# Patient Record
Sex: Female | Born: 1995 | Race: White | Hispanic: No | Marital: Single | Smoking: Never smoker
Health system: Southern US, Community
[De-identification: ages and names within clinical notes are randomized; demographics above are authoritative.]

## PROBLEM LIST (undated history)

## (undated) DIAGNOSIS — T148XXA Other injury of unspecified body region, initial encounter: Secondary | ICD-10-CM

## (undated) HISTORY — PX: KNEE ARTHROSCOPY W/ ACL RECONSTRUCTION: SHX1858

---
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
## 2017-02-27 ENCOUNTER — Encounter (INDEPENDENT_AMBULATORY_CARE_PROVIDER_SITE_OTHER): Payer: Self-pay | Admitting: INTERNAL MEDICINE

## 2017-02-27 NOTE — Progress Notes (Signed)
I spoke to Difficult RunSerafina while she is away at college.     Dx -- Strep pharyngitis again. Her student health service prescribed her PVK, which is fine.   She asked about tonsil removal. I recommended not doing that at this time. Let's see if her health improves as she finishes her freshman year of college.   If she continues to have multiple episodes of pharyngitis requiring antibiotics, then I will consider ENT referral for discussion of this

## 2017-04-16 ENCOUNTER — Ambulatory Visit (INDEPENDENT_AMBULATORY_CARE_PROVIDER_SITE_OTHER): Payer: Self-pay | Admitting: INTERNAL MEDICINE

## 2017-04-16 MED ORDER — VENTOLIN HFA 90 MCG/ACTUATION AEROSOL INHALER
INHALATION_SPRAY | RESPIRATORY_TRACT | 5 refills | Status: DC
Start: 2017-04-16 — End: 2021-07-09

## 2017-04-16 NOTE — Telephone Encounter (Signed)
Kimberly Cooke came by to see me at my office today     CC: chronic cough and a stuffy nose "for weeks"     She has not been taking any medication   Just finished finals and came home from college for the summer   No fevers   Otherwise feels fine     PE:   Ears -- clear on both sides   Nose -- thin DC on both sides: clear, no thick at all   Throat -- post-nasal drip; no tonsilitis    Lungs -- wheezing on inhalation in BL upper lobes (prominent); less prominent in lower lobes     Dx:   Seasonal allergic rhinitis --> Rx Allegra 180mg  daily for one week. If not sufficient, then I will add daily Flonase on top of this     Reactive airways -- ? Cough-variant asthma vs. Allergic airways --> albuterol inhaler 1 puff TID for the coming week, to see if it helps If it does, then continue the whole allergy season.     Kimberly BrunetteFina will contact me in a week and let me know how she is doing

## 2017-06-27 ENCOUNTER — Other Ambulatory Visit
Admission: RE | Admit: 2017-06-27 | Discharge: 2017-06-27 | Disposition: A | Discharge status: Home / Self Care | Payer: No Typology Code available for payment source | Source: Ambulatory Visit | Attending: Gynecology | Admitting: Gynecology

## 2017-06-30 LAB — VH STD AMPLIFIED DNA PROBE
Chlamydia trachomatis: NEGATIVE
Neisseria gonorrhoeae: NEGATIVE

## 2017-09-16 ENCOUNTER — Ambulatory Visit (INDEPENDENT_AMBULATORY_CARE_PROVIDER_SITE_OTHER): Payer: Self-pay | Admitting: INTERNAL MEDICINE

## 2017-12-15 ENCOUNTER — Encounter (INDEPENDENT_AMBULATORY_CARE_PROVIDER_SITE_OTHER): Payer: Self-pay | Admitting: INTERNAL MEDICINE

## 2017-12-15 NOTE — Progress Notes (Signed)
Encounter opened to scan records

## 2018-07-06 ENCOUNTER — Encounter (INDEPENDENT_AMBULATORY_CARE_PROVIDER_SITE_OTHER): Payer: Self-pay | Admitting: INTERNAL MEDICINE

## 2018-07-06 NOTE — Progress Notes (Signed)
Encounter opened to scan records

## 2018-07-10 ENCOUNTER — Encounter (INDEPENDENT_AMBULATORY_CARE_PROVIDER_SITE_OTHER): Payer: Self-pay | Admitting: INTERNAL MEDICINE

## 2018-07-10 ENCOUNTER — Other Ambulatory Visit (INDEPENDENT_AMBULATORY_CARE_PROVIDER_SITE_OTHER): Payer: Self-pay | Admitting: INTERNAL MEDICINE

## 2018-07-10 MED ORDER — ACYCLOVIR 800 MG TABLET
ORAL_TABLET | ORAL | 0 refills | Status: DC
Start: 2018-07-10 — End: 2021-07-09

## 2018-07-10 MED ORDER — TRIAMCINOLONE ACETONIDE 0.5 % TOPICAL CREAM
TOPICAL_CREAM | CUTANEOUS | 2 refills | Status: DC
Start: 2018-07-10 — End: 2021-07-09

## 2018-07-10 NOTE — Progress Notes (Signed)
Kimberly Cooke came to see me today for an acute onset of a "tingling, itching, burning" wash on her right arm.     Started in the past 24 hours.   Feels like a sunburn. Also itches.     It has a raised characteristic to it like poison ivy.     Exam:   Rash on the right arm; right forearm and on the anterior right shoulder: looks worrisome for shingles   No weeping   No excoriation     Dx --   Shingles vs. Atypical poison ivy. The long streak down the arm makes shingles so much more likely.     Rx:   Acyclovir 800mg  QID for one week   Topical triamcinolone TID to skin for itching/burning     Keep arm covered   Call me next week to let me know how it is progressing

## 2018-07-21 ENCOUNTER — Other Ambulatory Visit
Admission: RE | Admit: 2018-07-21 | Discharge: 2018-07-21 | Disposition: A | Discharge status: Home / Self Care | Payer: No Typology Code available for payment source | Source: Ambulatory Visit | Attending: Gynecology | Admitting: Gynecology

## 2018-07-22 LAB — VH STD AMPLIFIED DNA PROBE
Chlamydia trachomatis: NEGATIVE
Neisseria gonorrhoeae: NEGATIVE

## 2018-08-11 ENCOUNTER — Ambulatory Visit (INDEPENDENT_AMBULATORY_CARE_PROVIDER_SITE_OTHER): Payer: BC Managed Care – PPO

## 2018-08-11 ENCOUNTER — Encounter (INDEPENDENT_AMBULATORY_CARE_PROVIDER_SITE_OTHER): Payer: Self-pay

## 2018-08-11 DIAGNOSIS — Z23 Encounter for immunization: Secondary | ICD-10-CM

## 2018-08-11 DIAGNOSIS — A35 Other tetanus: Secondary | ICD-10-CM

## 2018-09-16 ENCOUNTER — Ambulatory Visit (INDEPENDENT_AMBULATORY_CARE_PROVIDER_SITE_OTHER): Payer: Self-pay | Admitting: INTERNAL MEDICINE

## 2018-09-16 MED ORDER — AMOXICILLIN 500 MG-POTASSIUM CLAVULANATE 125 MG TABLET
ORAL_TABLET | ORAL | 0 refills | Status: DC
Start: 2018-09-16 — End: 2021-07-09

## 2018-09-16 NOTE — Telephone Encounter (Signed)
Seen today by me:     Hx -- collided with another player in a [employer] soccer game   Dx - laceration on her tongue: 15mm long and about the half the depth of her tongue     Rx antibiotics to make sure it does not get infected   If if continues to bleed, then it may need to be sewn together     Augmentin 500 mg BID x 7 days --> [facility]   Call me later if her tongue starts to bleed and won't stop

## 2018-11-20 ENCOUNTER — Encounter (INDEPENDENT_AMBULATORY_CARE_PROVIDER_SITE_OTHER): Payer: Self-pay | Admitting: INTERNAL MEDICINE

## 2018-11-20 ENCOUNTER — Ambulatory Visit (INDEPENDENT_AMBULATORY_CARE_PROVIDER_SITE_OTHER): Payer: BC Managed Care – PPO | Admitting: INTERNAL MEDICINE

## 2018-11-20 VITALS — BP 118/60 | HR 52 | Temp 97.6°F | Ht 68.0 in | Wt 137.0 lb

## 2018-11-20 DIAGNOSIS — Z0000 Encounter for general adult medical examination without abnormal findings: Secondary | ICD-10-CM

## 2018-11-20 DIAGNOSIS — Z6820 Body mass index (BMI) 20.0-20.9, adult: Secondary | ICD-10-CM

## 2018-11-20 MED ORDER — IBUPROFEN 600 MG TABLET
600.00 mg | ORAL_TABLET | Freq: Every day | ORAL | 0 refills | Status: DC | PRN
Start: 2018-11-20 — End: 2021-07-09

## 2018-11-20 NOTE — Nursing Note (Signed)
BP 118/60   Pulse 52   Temp 36.4 C (97.6 F)   Ht 1.727 m (5\' 8" )   Wt 62.1 kg (137 lb)   SpO2 98%   BMI 20.83 kg/m   Facility age limit for growth percentiles is 20 years.  April Curry, [place]  11/20/2018, 13:23

## 2018-11-20 NOTE — Progress Notes (Signed)
[facility]   [address]   [place], [place] [address]  [phone]  [phone]    Wellness Visit     SUBJECTIVE   Kimberly Cooke is a 22 y.o. female here to check-in on primary care issues. It has been several years since she had a real check-in with a PCP.     She did injure her in a tongue in a soccer game --> she stopped by the office and I looked and her tongue and sent her to a dentist.     Since our last visit together, has experienced the following healthcare interactions:   ER -- no visits in 2019   Urgent care visits -- no visits in 2019   Accidents with a car -- none in the past year   Injuries at home -- tongue injury this fall   Medications from other providers --   Surgeries or other procedures -- several stitches put in her tongue by the dentist     Vision -- went to the eye doctor summer 2019: contact/glasses for driving --> no serious eye disease    Hearing -- no new damage to he hearing     Sources of pain which are new -- her knees do hurt most days     Sleep quality -- no issues     Tobacco products -- no vaping     Alcohol consumption -- typically once per week     Contact with other providers -- saw the GYN provider in 2019     Exercise inventory -- she is extremely physically active     Medications reviewed by me personally at this time:   3 ibuprofen ibuprofen daily PRN for knee pain --> takes after working out   Oral birth control pills   No energy drinks   No herbal products     Changes in family history since we last met --   Mother -- alive and well   Father -- died of pancreatic cancer in his 23's     Changes in social history since we last met --   Going to [employer]       Medications reviewed    Current Outpatient Medications:   .  acyclovir (ZOVIRAX) 800 mg Oral Tablet, 1 4 times per day for SHINGLES, Disp: 30 Tab, Rfl: 0  .  amoxicillin-pot clavulanate (AUGMENTIN) 500-125 mg Oral Tablet, 1 twice per day with food, Disp: 14 Tab, Rfl: 0  .   triamcinolone acetonide (ARISTOCORT) 0.5 % Cream, Use a thin layer to affected skin 3 times per day, Disp: 1 Tube, Rfl: 2  .  VENTOLIN HFA 90 mcg/actuation Inhalation HFA Aerosol Inhaler, 1 puff 3 times per day for coughing, Disp: 1 Inhaler, Rfl: 5      Review of Systems -     Constitutional: weight loss intentionally in the past year   Eyes: no new blurry or double vision   ENT: no acute hearing loss   Cardiovascular: no new chest pain   Respiratory: no new shortness of breath  GI: no new vomiting or diarrhea   GU: no new dysuria or hematuria   Musculoskeletal: chronic aches and pains   Neurological: no new seizure or headaches   Emotional/Pscychiatric: no depression or anxiety that is new   Skin: no suspicious moles which are new; no acute rashes   Endocrine: no new diabetes   Hematologic/Lymphatic: no DVT or bleeding issues      Social History  Tobacco Use   Smoking Status Never Smoker   Smokeless Tobacco Never Used       OBJECTIVE:  There were no vitals taken for this visit.        Counseling done about the following face-to-face:     Labs reviewed by me   Drug-drug interactions considered and reviewed by me     Physical Exam     General -- alert and oriented   Repeat BP taken by me in the left arm -- 95/60     HEENT -- PERRL, EOMI   Heart -- RRR and rhythm   Neck -- Thyroid is normal sized and not tender   Lungs -- clear and no wheezing; no stridor and no pleuritic pain   Abdomen -- soft and tender; nor guarding and   Neuro -- normal gait and tone; strength is 5/5  Skin -- no rashes noted   Psych -- pleasant and smiling   Vascular -- no edema     Current weight 137-lbs   A year ago -- 150-lbs     We talked about her weight change in the past year: her diet is very clean; she is working out more and sounds super=-fit. No eating disorder concerns on my part       Medications -- reviewed by me; drug-drug interactions considered by me    Counseling provided directly face-to-face about: getting a flu shot today          ASSESSMENT AND PLAN:  Multiple previous knee surgeries -- Kimberly Cooke has had both of her knee operated on in the past. I see no reason to go back to Orthopedics at this time.   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   Her use of ibuprofen is not that often, so I can live with it.  I see no reason to go back to Orthopedics at this time      Screening for Diabetes -- no reason to check until after 20 years old     High cholesterol screening -- no reason to screen until after 20 years old     Vision -- has eye glasses and does well     Hearing issues -- no limitations     Exercise --  She is very fit     Need for HIV or hepatitis C screening -- no new risk factors; no reason for screening     Tobacco abuse screening -- no tobacco products     Alcohol and drug abuse screening -- weekly alcohol use: denies drinking     Depression/anxiety screening -- she denies any issues     Screening for colorectal cancer -- she can wait until she is 22 years old     Screening for breast cancer -- no elevated family history at all.     GYN -- she sees Dr. Nicole Kindred     Immunizations -- I recommend a yearly flu shot: she will get together        DISPOSITION:   Follow up in a year sooner PRN   Flu shot given today     Keep up the great work

## 2018-12-25 ENCOUNTER — Telehealth (INDEPENDENT_AMBULATORY_CARE_PROVIDER_SITE_OTHER): Payer: Self-pay | Admitting: INTERNAL MEDICINE

## 2018-12-25 NOTE — Telephone Encounter (Signed)
Patient called, started yesterday with a headache, last night she had a sore throat, this morning she noticed it's swollen and has 2 white spots on the right side tonsil, she has gargled with salt water, no relief she would like to be seen today

## 2018-12-29 NOTE — Telephone Encounter (Signed)
Message was closed by the Physician

## 2019-07-26 ENCOUNTER — Other Ambulatory Visit
Admission: RE | Admit: 2019-07-26 | Discharge: 2019-07-26 | Disposition: A | Discharge status: Home / Self Care | Payer: No Typology Code available for payment source | Source: Ambulatory Visit | Attending: Gynecology | Admitting: Gynecology

## 2019-07-27 ENCOUNTER — Other Ambulatory Visit: Payer: Self-pay | Admitting: Gynecology

## 2019-07-27 ENCOUNTER — Other Ambulatory Visit
Admission: RE | Admit: 2019-07-27 | Discharge: 2019-07-27 | Disposition: A | Discharge status: Home / Self Care | Payer: No Typology Code available for payment source | Source: Ambulatory Visit | Attending: Gynecology | Admitting: Gynecology

## 2019-07-27 DIAGNOSIS — Z01419 Encounter for gynecological examination (general) (routine) without abnormal findings: Secondary | ICD-10-CM

## 2019-07-28 LAB — VH STD AMPLIFIED DNA PROBE
Chlamydia trachomatis: NEGATIVE
Neisseria gonorrhoeae: NEGATIVE

## 2019-08-03 ENCOUNTER — Other Ambulatory Visit (INDEPENDENT_AMBULATORY_CARE_PROVIDER_SITE_OTHER): Payer: Self-pay | Admitting: INTERNAL MEDICINE

## 2019-08-03 ENCOUNTER — Ambulatory Visit: Payer: BC Managed Care – PPO | Attending: INTERNAL MEDICINE

## 2019-08-03 DIAGNOSIS — Z20828 Contact with and (suspected) exposure to other viral communicable diseases: Secondary | ICD-10-CM | POA: Insufficient documentation

## 2019-08-03 DIAGNOSIS — J988 Other specified respiratory disorders: Secondary | ICD-10-CM | POA: Insufficient documentation

## 2019-08-03 DIAGNOSIS — U071 COVID-19: Secondary | ICD-10-CM

## 2019-08-04 LAB — COVID-19 PANTHER - LAB USE ONLY: SARS-CoV-2: NOT DETECTED

## 2019-10-22 ENCOUNTER — Other Ambulatory Visit (INDEPENDENT_AMBULATORY_CARE_PROVIDER_SITE_OTHER): Payer: Self-pay | Admitting: INTERNAL MEDICINE

## 2019-10-22 DIAGNOSIS — U071 COVID-19: Secondary | ICD-10-CM

## 2019-10-23 ENCOUNTER — Ambulatory Visit: Payer: BC Managed Care – PPO | Attending: INTERNAL MEDICINE

## 2019-10-23 DIAGNOSIS — U071 COVID-19: Secondary | ICD-10-CM

## 2019-10-23 DIAGNOSIS — Z20828 Contact with and (suspected) exposure to other viral communicable diseases: Secondary | ICD-10-CM | POA: Insufficient documentation

## 2019-10-23 LAB — COVID-19 PANTHER - LAB USE ONLY: SARS-CoV-2: NOT DETECTED

## 2019-11-24 ENCOUNTER — Encounter (INDEPENDENT_AMBULATORY_CARE_PROVIDER_SITE_OTHER): Payer: Self-pay | Admitting: INTERNAL MEDICINE

## 2019-11-24 ENCOUNTER — Ambulatory Visit (INDEPENDENT_AMBULATORY_CARE_PROVIDER_SITE_OTHER): Payer: BC Managed Care – PPO | Admitting: INTERNAL MEDICINE

## 2019-11-24 ENCOUNTER — Other Ambulatory Visit: Payer: Self-pay

## 2019-11-24 VITALS — BP 116/64 | HR 58 | Temp 97.8°F | Ht 68.0 in | Wt 144.0 lb

## 2019-11-24 DIAGNOSIS — Z23 Encounter for immunization: Secondary | ICD-10-CM

## 2019-11-24 DIAGNOSIS — Z0000 Encounter for general adult medical examination without abnormal findings: Secondary | ICD-10-CM

## 2019-11-24 NOTE — Nursing Note (Signed)
BP 116/64   Pulse 58   Temp 36.6 C (97.8 F)   Ht 1.727 m (5\' 8" )   Wt 65.3 kg (144 lb)   SpO2 98%   BMI 21.90 kg/m     Alida Greiner, [place]  11/24/2019, 09:15'

## 2019-11-24 NOTE — Progress Notes (Signed)
[facility]   [address]   [place], [place] [address]  [phone]  [phone]    Wellness Visit     SUBJECTIVE   Kimberly Cooke is a 23 y.o. female here to check-in on primary care issues.     I saw her in the office a year ago     COVID -- has avoided, she believes     Since our last visit together, has experienced the following healthcare interactions:   ER -- no visits in 2020   Urgent care visits -- no visits in 2020   Accidents with a car -- nothing in 2020   Injuries at home -- nothing in 2020 (did have a tongue trauma in 2019)   Medications from other providers -- nothing that is new   Surgeries or other procedures -- nothing invasive in 2020     Vision -- went to the eye doctor summer 2019  contact/glasses for driving: no changes      Hearing -- no new damage   No ringing   No exposure to firearms     Sources of pain which are new -- her knees    Sleep quality -- no issues     Tobacco products -- no vaping     Alcohol consumption -- typically once per week     Contact with other providers -- saw the GYN provider in 2020    Exercise inventory -- she is very physically fit    Depression during COVID -- definitely some     Medications reviewed by me :   Oral birth control pills   Ibuprofen 2 just PRN for her left knee --> takes once/week after a long run     No energy drinks   NO CBD products   No herbal products     Changes in family history since we last met --   Mother -- alive and well   Father -- died of pancreatic cancer in his 81's     Changes in social history since we last met --   Going to [employer]  No recent travel    [employer] about graduate school -- ? [employer] or [employer] or somewhere else       Medications reviewed    Current Outpatient Medications:   .  acyclovir (ZOVIRAX) 800 mg Oral Tablet, 1 4 times per day for SHINGLES, Disp: 30 Tab, Rfl: 0  .  amoxicillin-pot clavulanate (AUGMENTIN) 500-125 mg Oral Tablet, 1 twice per day with food,  Disp: 14 Tab, Rfl: 0  .  Ibuprofen (MOTRIN) 600 mg Oral Tablet, Take 1 Tab (600 mg total) by mouth Once per day as needed for Pain, Disp: 6 Tab, Rfl: 0  .  triamcinolone acetonide (ARISTOCORT) 0.5 % Cream, Use a thin layer to affected skin 3 times per day, Disp: 1 Tube, Rfl: 2  .  VENTOLIN HFA 90 mcg/actuation Inhalation HFA Aerosol Inhaler, 1 puff 3 times per day for coughing, Disp: 1 Inhaler, Rfl: 5      Review of Systems -     Constitutional: weight loss   Eyes: no new blurry or double vision   ENT: no acute hearing loss   Cardiovascular: no new chest pain   Respiratory: no new shortness of breath  GI: no new vomiting or diarrhea   GU: no new dysuria or hematuria   Musculoskeletal: chronic aches and pains   Neurological: no new seizure or headaches  Emotional/Pscychiatric: no depression or anxiety that is new   Skin: no suspicious moles which are new; no acute rashes   Endocrine: no new diabetes   Hematologic/Lymphatic: no DVT or bleeding issues      Social History     Tobacco Use   Smoking Status Never Smoker   Smokeless Tobacco Never Used       OBJECTIVE:  There were no vitals taken for this visit.        Counseling done about the following face-to-face:     Labs reviewed by me   Drug-drug interactions considered and reviewed by me     Physical Exam     General -- alert and oriented   Repeat BP taken by me in the left arm -- 96/60     HEENT -- PERRL, EOMI   Heart -- RRR and rhythm   Neck -- Thyroid is normal sized  Lungs -- clear and no wheezing; no stridor  Abdomen -- soft and tender; nor guarding and   Neuro -- normal gait and tone; strength is 5/5  Skin -- no rashes noted   Psych -- pleasant and smiling   Vascular -- no edema in either leg       Labs   November 2020   COVID - negative     August 2020   COVID - negative       Medications -- reviewed by me; drug-drug interactions considered by me    Counseling provided directly face-to-face about: getting a flu shot today       ASSESSMENT AND PLAN:  Multiple  previous knee surgeries -- Kimberly Cooke has had both of her knee operated on in the past. I see no reason to go back to Orthopedics   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   Her use of ibuprofen is much less than before and she is doing markedly better after stopping competitive soccer.     Screening for Diabetes -- no reason to check until after 21 years old     High cholesterol screening -- no reason to screen until after 21 years old     Vision -- has eye glasses    Hearing issues -- no limitations     Exercise --  She is very fit     Need for HIV or hepatitis C screening -- no new risk factors; no reason to screen     Tobacco abuse screening -- no tobacco products whatsoever     Alcohol and drug abuse screening -- weekly alcohol use: denies drinking     Depression/anxiety screening -- she denies any issues     Family history of pancreatic cancer -- father died of pancreatic cancer. Kimberly Cooke was tested and is negative for genes related to pancreatic cancer     Screening for colorectal cancer -- she can wait until she is 23 years old     Screening for breast cancer -- no elevated family history at all.   Can wait until she is 23 years old for mammography     GYN -- she sees Dr. Nicole Cooke     Immunizations -- I recommend a yearly flu shot: get today       DISPOSITION:   Follow up in a year sooner PRN   Flu shot given today   Mask in public   COVID vaccine as soon as it is offered to her       Keep up the great work with such a  healthy lifestyle

## 2019-11-27 ENCOUNTER — Telehealth: Payer: BLUE CROSS/BLUE SHIELD | Admitting: Family

## 2019-11-27 VITALS — BP 121/80 | HR 84 | Temp 99.5°F | Resp 18 | Ht 68.0 in | Wt 145.0 lb

## 2019-11-27 DIAGNOSIS — B349 Viral infection, unspecified: Secondary | ICD-10-CM

## 2019-11-27 DIAGNOSIS — J029 Acute pharyngitis, unspecified: Secondary | ICD-10-CM

## 2019-11-27 DIAGNOSIS — Z03818 Encounter for observation for suspected exposure to other biological agents ruled out: Secondary | ICD-10-CM

## 2019-11-27 LAB — VH AMB POCT SOFIA (TM)SARS CORONAVIRUS ANTIGEN FIA: Sofia SARS-CoV-2 Ag POCT: NEGATIVE

## 2019-11-27 LAB — POCT RAPID STREP A: Rapid Strep A Screen POCT: NEGATIVE

## 2019-11-27 NOTE — Progress Notes (Signed)
Subjective:    Patient ID: Joann Lewis is a 23 y.o. female.    HPI    The patient was evaluated using telehealth platform. Exam findings are visual observation and/or having patient perform various physical exam tasks under my guidance. Verbal consent for evaluation was obtained.  Pt was then brought into exam room:  PPE included N95, goggles, gown and gloves. Pt wearing surgical mask.   Awoke this morning with headaches, bodyaches and nausea and fatigue. Her roommate had mono two weeks ago.   No fever, no vomiting or diarrhea. Mild  sore throat, mild cervical lymphadenopathy. No cough, no congestion. No   Roommate had mono two weeks ago. Pt also reports that she has a tonsil   Received flu vaccine 3 days ago. She reports that she always tolerates a flu vaccine well.   She denies any abdominal pain. Does not play contact sports. Has taken nothing for symptoms today. No loss taste or smell.   No known exposure to COVID-19 persons. She socially distances and wears mask.     The following portions of the patient's history were reviewed and updated as appropriate: allergies, current medications, past family history, past medical history, past social history, past surgical history and problem list.    Review of Systems   Constitutional: Negative for chills and fever.   HENT: Positive for sore throat. Negative for congestion, ear pain and rhinorrhea.    Respiratory: Negative for cough, shortness of breath and wheezing.    Cardiovascular: Negative for chest pain.   Gastrointestinal: Negative for abdominal pain, diarrhea, nausea and vomiting.   Musculoskeletal: Positive for myalgias. Negative for arthralgias.   Skin: Negative for rash.   Allergic/Immunologic: Negative for immunocompromised state.   Neurological: Positive for headaches. Negative for dizziness and light-headedness.   Hematological: Positive for adenopathy. Does not bruise/bleed easily.   Psychiatric/Behavioral: Negative for confusion.          Objective:    BP 121/80   Pulse 84   Temp 99.5 F (37.5 C) (Oral)   Resp 18   Ht 1.727 m (5\' 8" )   Wt 65.8 kg (145 lb)   LMP 11/13/2019   SpO2 98%   BMI 22.05 kg/m     Physical Exam  Vitals signs and nursing note reviewed.   Constitutional:       General: She is not in acute distress.     Appearance: She is well-developed. She is ill-appearing (mildly).   HENT:      Head: Normocephalic.      Right Ear: Tympanic membrane normal.      Left Ear: Tympanic membrane normal.      Nose: Nose normal.      Mouth/Throat:      Mouth: Mucous membranes are moist.      Pharynx: Posterior oropharyngeal erythema present.      Tonsils: No tonsillar exudate. 2+ on the right. 1+ on the left.      Comments: Tonsil stone noted right tonsil  Eyes:      Conjunctiva/sclera: Conjunctivae normal.   Cardiovascular:      Rate and Rhythm: Normal rate.      Heart sounds: Normal heart sounds, S1 normal and S2 normal.   Pulmonary:      Effort: Pulmonary effort is normal.      Breath sounds: Normal breath sounds and air entry.   Abdominal:      General: Abdomen is flat. Bowel sounds are normal.      Palpations:  There is no splenomegaly.      Tenderness: There is no guarding or rebound.   Lymphadenopathy:      Cervical: Cervical adenopathy present.   Skin:     General: Skin is warm and dry.   Neurological:      Mental Status: She is alert and oriented to person, place, and time.           Assessment and Plan:         1. Sore throat  Results     Procedure Component Value Units Date/Time     [facility] SARS Coronavirus Antigen [facility] POCT [[id number]]  (Normal) Collected: 11/27/19 0930    Specimen: Nasal Swab COVID-19 Updated: 11/27/19 0948     [facility] SARS-CoV-2 Ag POCT Negative    POCT Rapid Group A Strep [[id number]]  (Normal) Collected: 11/27/19 0932    Specimen: Throat Updated: 11/27/19 0938     POCT QC Pass     Rapid Strep A Screen POCT Negative      Comment Negative Results should be confirmed by throat Cx to confirm absence of Strep A inf.           - POCT Rapid Group A Strep  - [facility] SARS Coronavirus Antigen FIA POCT  - SARS-CoV-2 Assay ([employer] System(TM)); Future    2. Viral illness  Explained to the patient/guardian that sxs are likely due to viral etiology at this time and antibiotics are not indicated.   Advised rest and fluids; discussed appropriate otc symptomatic treatment for use prn.   Follow up with PCP or RTC if there are any new or worsening symptoms or if the symptoms are lasting longer than expected.  Patient/guardian expressed understanding and agreement with plan of care at time of discharge.  Discussed with patient that as her symptoms are started today should be too early to test for mono, most likely diagnosis is mono based on exam as well as household contact with in the past 2 weeks.  Recommend self quarantine.  Also recommend PCR testing to be done on Monday.  Patient is agreeable.  Self quarantine until test results are received.  Patient advised risk of activity such as contact sports which she does not participate in.  Discussed that spleen may enlarge during mononucleosis infection and to avoid injury or direct trauma to the abdomen.      Renita Papa, NP  [facility]  11/27/2019  9:23 AM

## 2019-11-27 NOTE — Patient Instructions (Signed)
Viral Syndrome (Adult)  A viral illness may cause a number of symptoms such as fever. Other symptoms depend on the part of the body that the virus affects. If it settles in your nose, throat, and lungs, it may cause cough, sore throat, congestion, runny nose, headache, earache and other ear symptoms, or shortness of breath. If it settles in your stomach and intestinal tract, it may cause nausea, vomiting, cramping, and diarrhea. Sometimes it causes generalized symptoms like "aching all over," feeling tired, loss of energy, or loss of appetite.  A viral illness usually lasts anywhere from several days to several weeks, but sometimes it lasts longer. In some cases, a more serious infection can look like a viral syndrome in the first few days of the illness. You may need another exam and additional tests to know the difference. Watch for the warning signs listed below for when to seek medical advice.  Home care  Follow these guidelines for taking care of yourself at home:   If symptoms are severe, rest at home for the first 2 to 3 days.   Stay away from cigarette smoke - both your smoke and the smoke from others.   You may use over-the-counteracetaminophen or ibuprofen for fever, muscle aching, and headache, unless another medicine was prescribed for this. If you have chronic liver or kidney disease or ever had a stomach ulcer or gastrointestinal bleeding, talk with your healthcare provider before using these medicines. No one who is younger than 18 and ill with a fever should take aspirin. It may cause severe disease or death.   Your appetite may be poor, so a light diet is fine. Avoid dehydration by drinking 8 to 12, 8-ounce glasses of fluids each day. This may include water; orange juice; lemonade; apple, grape, and cranberry juice; clear fruit drinks; electrolyte replacement and sports drinks; and decaffeinated teas and coffee. If you have been diagnosed with a kidney disease, ask your healthcare provider  how much and what types of fluids you should drink to prevent dehydration. If you have kidney disease, drinking too much fluid can cause it build up in the your body and be dangerous to your health.   Over-the-counter remedies won't shorten the length of the illness but may be helpful for symptoms such as cough, sore throat, nasal and sinus congestion, or diarrhea. Don't use decongestants if you have high blood pressure.  Follow-up care  Follow up with your healthcare provider if you do not improve over the next week.  Call 911  Call 911 if any of the following occur:   Convulsion   Feeling weak, dizzy, or like you are going to faint   Chest pain, or more than mild shortness of breath  When to seek medical advice  Call your healthcare provider right away if any of these occur:   Cough with lots of colored sputum (mucus) or blood in your sputum   Chest pain, shortness of breath, wheezing, or trouble breathing   Severe headache; face, neck, or ear pain   Severe, constant pain in the lower right side of your belly (abdominal)   Continued vomiting (can't keep liquids down)   Frequent diarrhea (more than 5 times a day); blood (red or black color) or mucus in diarrhea   Feeling weak, dizzy, or like you are going to faint   Extreme thirst   Fever of 100.4F (38C) or higher, or as directed by your healthcare provider  [employer] last reviewed this educational content on 03/09/2017     2000-2020 [employer] [address], [place] [address]. All rights reserved. This information is not intended as a substitute for professional medical care. Always follow your healthcare professional's instructions.

## 2019-11-29 ENCOUNTER — Ambulatory Visit (INDEPENDENT_AMBULATORY_CARE_PROVIDER_SITE_OTHER): Payer: BLUE CROSS/BLUE SHIELD

## 2019-11-29 ENCOUNTER — Other Ambulatory Visit
Admission: RE | Admit: 2019-11-29 | Discharge: 2019-11-29 | Disposition: A | Discharge status: Home / Self Care | Payer: No Typology Code available for payment source | Source: Ambulatory Visit | Attending: Family | Admitting: Family

## 2019-11-29 DIAGNOSIS — J029 Acute pharyngitis, unspecified: Secondary | ICD-10-CM

## 2019-11-29 DIAGNOSIS — Z03818 Encounter for observation for suspected exposure to other biological agents ruled out: Secondary | ICD-10-CM

## 2019-11-30 ENCOUNTER — Telehealth: Payer: Self-pay

## 2019-11-30 LAB — VH SARS-COV-2 ASSAY (PERKINELMER SYSTEM(TM))
Date of Onset: 20201219
Does patient reside in a congregate care setting?: NEGATIVE
Is patient employed in a healthcare setting?: NEGATIVE
SARS-CoV-2 Assay (PerkinElmer System (TM)): NOT DETECTED

## 2019-11-30 NOTE — Telephone Encounter (Signed)
Performed patient call back; left message for patient to call back with questions/concerns.

## 2019-12-01 ENCOUNTER — Telehealth: Payer: Self-pay

## 2019-12-01 NOTE — Telephone Encounter (Addendum)
-----   Message from Marrian Salvage Delena Bali., PA sent at 12/01/2019  8:22 AM EST -----  Please notify patient of negative results    Patient was notified of the negative covid results, she is still suffering from fatigue but is believed to have mono. Patient was notified to stay hydrated and contact the office with questions.     Drue Novel  7:32 PM

## 2019-12-30 ENCOUNTER — Other Ambulatory Visit (HOSPITAL_COMMUNITY): Payer: Self-pay | Admitting: Internal Medicine

## 2019-12-30 DIAGNOSIS — Z1152 Encounter for screening for COVID-19: Secondary | ICD-10-CM

## 2019-12-31 ENCOUNTER — Ambulatory Visit: Payer: BC Managed Care – PPO | Attending: Internal Medicine

## 2019-12-31 DIAGNOSIS — Z20822 Contact with and (suspected) exposure to covid-19: Secondary | ICD-10-CM | POA: Insufficient documentation

## 2019-12-31 DIAGNOSIS — Z1152 Encounter for screening for COVID-19: Secondary | ICD-10-CM

## 2020-01-01 LAB — COVID-19 ~~LOC~~ MOLECULAR LAB TESTING: SARS-CoV-2: NOT DETECTED

## 2020-01-19 ENCOUNTER — Other Ambulatory Visit (INDEPENDENT_AMBULATORY_CARE_PROVIDER_SITE_OTHER): Payer: Self-pay | Admitting: INTERNAL MEDICINE

## 2020-01-19 ENCOUNTER — Ambulatory Visit: Payer: BC Managed Care – PPO | Attending: INTERNAL MEDICINE

## 2020-01-19 DIAGNOSIS — U071 COVID-19: Secondary | ICD-10-CM

## 2020-01-19 DIAGNOSIS — Z20822 Contact with and (suspected) exposure to covid-19: Secondary | ICD-10-CM | POA: Insufficient documentation

## 2020-01-20 LAB — COVID-19 PANTHER - LAB USE ONLY: SARS-CoV-2: NOT DETECTED

## 2020-03-10 ENCOUNTER — Ambulatory Visit (VACCINATION_CLINIC): Payer: Self-pay

## 2020-03-30 ENCOUNTER — Ambulatory Visit (VACCINATION_CLINIC): Payer: Self-pay

## 2020-03-31 ENCOUNTER — Ambulatory Visit (VACCINATION_CLINIC): Payer: Self-pay

## 2020-07-06 ENCOUNTER — Other Ambulatory Visit
Admission: RE | Admit: 2020-07-06 | Discharge: 2020-07-06 | Disposition: A | Discharge status: Home / Self Care | Payer: BLUE CROSS/BLUE SHIELD | Source: Ambulatory Visit | Attending: Gynecology | Admitting: Gynecology

## 2020-07-07 LAB — VH STD AMPLIFIED DNA PROBE
Chlamydia trachomatis: NEGATIVE
Neisseria gonorrhoeae: NEGATIVE

## 2020-08-24 ENCOUNTER — Other Ambulatory Visit: Payer: Self-pay

## 2020-08-24 ENCOUNTER — Encounter (FREE_STANDING_LABORATORY_FACILITY): Payer: BC Managed Care – PPO | Admitting: NURSE PRACTITIONER, FAMILY

## 2020-08-24 ENCOUNTER — Encounter (INDEPENDENT_AMBULATORY_CARE_PROVIDER_SITE_OTHER): Payer: Self-pay

## 2020-08-24 ENCOUNTER — Encounter (FREE_STANDING_LABORATORY_FACILITY)
Admit: 2020-08-24 | Discharge: 2020-08-24 | Disposition: A | Discharge status: Home / Self Care | Payer: BC Managed Care – PPO | Attending: NURSE PRACTITIONER, FAMILY | Admitting: NURSE PRACTITIONER, FAMILY

## 2020-08-24 ENCOUNTER — Ambulatory Visit (INDEPENDENT_AMBULATORY_CARE_PROVIDER_SITE_OTHER): Payer: BC Managed Care – PPO

## 2020-08-24 VITALS — BP 127/77 | HR 75 | Temp 98.2°F | Resp 18 | Ht 68.0 in | Wt 150.0 lb

## 2020-08-24 DIAGNOSIS — J069 Acute upper respiratory infection, unspecified: Secondary | ICD-10-CM | POA: Insufficient documentation

## 2020-08-24 NOTE — Patient Instructions (Signed)
[facility] [employer]      Operated by [facility]  [address] [place], [place] [address]  Phone: [phone])  Fax: [phone]  [email or web address]  Open Daily 8:00a - 8:00p    Closed Thanksgiving and Christmas Day  [facility]     Operated by [facility]  [address]  [facility]  [place], [place] [address]  Phone: [phone])  Fax: [phone]  [email or web address]  Open M-F  8:00a - 8:00p  Sat              10:00a - 4:00p  Sun             Closed  Closed all [place] Holidays        Attending Caregiver: Armen Pickup, APRN,FNP-BC      Today's orders:   Orders Placed This Encounter   . COVID-19 SCREENING OUTPATIENT AND DRIVE UP TESTING        Prescription(s) E-Rx to:  [id number] - , [place] - [address] AT [facility] [address]    ________________________________________________________________________  Short Term Disability and Family Medical Leave Act  [facility] does NOT provide assistance with any disability applications.  If you feel your medical condition requires you to be on disability, you will need to follow up with  your primary care physician or a specialist.  We apologize for any inconvenience.    For Medication Prescribed by [facility]:  As an Urgent Care facility, our clinic does NOT offer prescription refills over the telephone.    If you need more of the medication one of our medical providers prescribed, you will  either need to be re-evaluated by [place] or see your primary care physician.    ________________________________________________________________________      It is very important that we have a phone number.  This is the single best way to contact you in the event that we become aware of important clinical information or concerns after your discharge.  If the phone number you provided at registration is NOT this number you  should inform staff and registration prior to leaving.      Your treatment and evaluation today was focused on identifying and treating potentially emergent conditions based on your presenting signs, symptoms, and history.  The resulting initial clinical impression and treatment plan is not intended to be definitive or a substitute for a full physical examination and evaluation by your primary care provider.  If your symptoms persist, worsen, or you develop any new or concerning symptoms, you need to be evaluated.      If you received x-rays during your visit, be aware that the final and formal interpretation of those films by a radiologist may occur after your discharge.  If there is a significant discrepancy identified after your discharge, we will contact you at the telephone number provided at registration.      If you received a pelvic exam, you may have cultures pending for sexually transmitted diseases.  Positive cultures are reported to the [facility] Department of Health as required by state law.  You may contact the Health Information Management Office of [facility] to get a copy of your results.     If you are over 56 year old, we cannot discuss your personal health information with a parent, spouse, family member, or anyone else without your consent.  This does not include those who  have legitimate access to your records and information to assist in your care under the provisions of HIPAA [facility] Portability and Accountability Act) law, or those to whom you have previously given written consent to do so, such a legal guardian or Power of [place].      Instructions are discussed with patient upon discharge by clinical staff with all questions answered.  Please call [facility] [phone]) if any further questions develop.  Go immediately to the emergency department if any concerns or worsening symptoms.    Drink warm beverages with honey.   Push fluids.   Tylenol and ibuprofen for  sore throat and body aches.   Quarantine until you have the results of your COVID test. It must be 7 days from the start of your symptoms if negative, and 10 days from the start of your symptoms if positive.     Armen Pickup, APRN,FNP-BC 08/24/2020, 14:29

## 2020-08-24 NOTE — Progress Notes (Signed)
History of Present Illness: Kimberly Cooke is a 24 y.o. female who presents to the Urgent Care today with chief complaint of    Chief Complaint            Headache     Productive Cough     Shortness of Breath       .Pt reports headache for the past two days, productive cough, hoarse voice and feeling winded. She is vaccinated for COVID. She states that she was vaccinated in April. Pt reports that it began as a sore throat and has progressed into the symptoms she has currently. Denies environmental allergies.     I reviewed and confirmed the patient's past medical history taken by the nurse or medical assistant with the addition of the following:    Past Medical History:    History reviewed. No pertinent past medical history.      Past Surgical History:    History reviewed. No past surgical history pertinent negatives.      Allergies:  No Known Allergies  Medications:    Current Outpatient Medications   Medication Sig   . acyclovir (ZOVIRAX) 800 mg Oral Tablet 1 4 times per day for SHINGLES   . amoxicillin-pot clavulanate (AUGMENTIN) 500-125 mg Oral Tablet 1 twice per day with food   . Ibuprofen (MOTRIN) 600 mg Oral Tablet Take 1 Tab (600 mg total) by mouth Once per day as needed for Pain   . triamcinolone acetonide (ARISTOCORT) 0.5 % Cream Use a thin layer to affected skin 3 times per day   . VENTOLIN HFA 90 mcg/actuation Inhalation HFA Aerosol Inhaler 1 puff 3 times per day for coughing     Social History:    Social History     Tobacco Use   . Smoking status: Never Smoker   . Smokeless tobacco: Never Used   Substance Use Topics   . Alcohol use: Not on file   . Drug use: Not on file     Family History: No significant family history.  Family Medical History:    None           Review of Systems:    General: Denies Fever, chills, loss of activity  Head: No trauma, or injury +headache  ENT: No earache, epitaxies, sore throat  Neck: No masses, trauma or injury  Pulmonary: No SOB, wheezing  +cough  Cardiovascular: No CP, palpitations, or lower extremity swelling  GI: Denies nausea, vomiting, diarrhea, abdominal pain  GU: denies urinary frequency, hematuria, urinary incontinence, or urinary retention.  Musculoskeletal: Denies any tenderness, injury, swelling  Skin: no rash or erythema or bruising  Neurologic: Denies any confusion or weakness, numbness or tingling    Physical Exam:  Vital signs:   Vitals:    08/24/20 1353   BP: 127/77   Pulse: 75   Resp: 18   Temp: 36.8 C (98.2 F)   TempSrc: Oral   SpO2: 98%   Weight: 68 kg (150 lb)   Height: 1.727 m (5\' 8" )   BMI: 22.86         Body mass index is 22.81 kg/m. Facility age limit for growth percentiles is 20 years.  Patient's last menstrual period was 08/17/2020 (approximate).    General:  Well appearing and No acute distress  Head:  Normocephalic and Atraumatic  Eyes:  Normal lids/lashes, PERRL, and EOMI  ENT:  normal TM's, normal pharynx/tonsils, and normal tongue/uvula +hoarseness  Neck:  supple  Pulmonary:  clear to auscultation bilaterally  Cardiovascular:  regular rate/rhythm and normal S1/S2  Gastrointestinal:  normal bowel sounds, soft, and non-tender  Genito-urinary:  no CVAT  Musculoskeletal:  5/5 strength in all extremities  Skin:  warm/dry and no rash  Psychiatric:  Appropriate affect and behavior  Neurologic:   Alert and oriented x 3    Data Reviewed:    Not applicable    Course: Condition at discharge: Good     Differential Diagnosis: URI vs COVID vs seasonal allergies    Assessment:   1. URI (upper respiratory infection)        Plan:    Orders Placed This Encounter   . COVID-19 SCREENING OUTPATIENT AND DRIVE UP TESTING       Drink warm beverages with honey.   Push fluids.   Tylenol and ibuprofen for sore throat and body aches.   Quarantine until you have the results of your COVID test. It must be 7 days from the start of your symptoms if negative, and 10 days from the start of your symptoms if positive    Go to Emergency Department  immediately for further work up if any concerning symptoms.  Plan was discussed and patient verbalized understanding.  If symptoms are worsening or not improving the patient should return to the Urgent Care for further evaluation.    The pt was seen independently by myself with the supervising physician available for consult.   The supervising/collaborating physician on site for this visit was Dr. Edwyna Shell.    Armen Pickup, APRN,FNP-BC 08/24/2020, 20:57

## 2020-08-25 LAB — COVID-19 ~~LOC~~ MOLECULAR LAB TESTING: SARS-CoV-2: NOT DETECTED

## 2020-11-24 ENCOUNTER — Encounter (INDEPENDENT_AMBULATORY_CARE_PROVIDER_SITE_OTHER): Payer: Self-pay | Admitting: INTERNAL MEDICINE

## 2020-11-24 ENCOUNTER — Other Ambulatory Visit: Payer: Self-pay

## 2020-11-24 ENCOUNTER — Ambulatory Visit (INDEPENDENT_AMBULATORY_CARE_PROVIDER_SITE_OTHER): Payer: BC Managed Care – PPO | Admitting: INTERNAL MEDICINE

## 2020-11-24 VITALS — BP 102/74 | HR 55 | Temp 96.9°F | Ht 68.0 in | Wt 142.0 lb

## 2020-11-24 DIAGNOSIS — Z0000 Encounter for general adult medical examination without abnormal findings: Secondary | ICD-10-CM

## 2020-11-24 DIAGNOSIS — Z23 Encounter for immunization: Secondary | ICD-10-CM

## 2020-11-24 NOTE — Nursing Note (Signed)
BP 102/74    Pulse 55    Temp 36.1 C (96.9 F) (Thermal Scan)    Ht 1.727 m (5\' 8" )    Wt 64.4 kg (142 lb)    SpO2 99%    BMI 21.59 kg/m     , MA  11/24/2020, 08:56
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
## 2021-07-09 ENCOUNTER — Other Ambulatory Visit: Payer: Self-pay

## 2021-07-09 ENCOUNTER — Emergency Department
Admission: EM | Admit: 2021-07-09 | Discharge: 2021-07-09 | Payer: BC Managed Care – PPO | Attending: Emergency Medicine | Admitting: Emergency Medicine

## 2021-07-09 ENCOUNTER — Emergency Department (EMERGENCY_DEPARTMENT_HOSPITAL): Payer: BC Managed Care – PPO

## 2021-07-09 ENCOUNTER — Encounter (HOSPITAL_COMMUNITY): Payer: Self-pay | Admitting: Emergency Medicine

## 2021-07-09 DIAGNOSIS — R109 Unspecified abdominal pain: Secondary | ICD-10-CM

## 2021-07-09 LAB — COMPREHENSIVE METABOLIC PANEL, NON-FASTING
ALBUMIN: 3.7 g/dL (ref 3.5–5.0)
ALKALINE PHOSPHATASE: 50 U/L (ref 40–110)
ALT (SGPT): 10 U/L (ref 8–22)
ANION GAP: 8 mmol/L (ref 4–13)
AST (SGOT): 13 U/L (ref 8–45)
BILIRUBIN TOTAL: 0.3 mg/dL (ref 0.3–1.3)
BUN/CREA RATIO: 15 (ref 6–22)
BUN: 13 mg/dL (ref 8–25)
CALCIUM: 9.3 mg/dL (ref 8.5–10.0)
CHLORIDE: 108 mmol/L (ref 96–111)
CO2 TOTAL: 26 mmol/L (ref 22–30)
CREATININE: 0.85 mg/dL (ref 0.60–1.05)
ESTIMATED GFR: >90 mL/min/BSA (ref >=60)
GLUCOSE: 94 mg/dL (ref 65–125)
POTASSIUM: 3.9 mmol/L (ref 3.5–5.1)
PROTEIN TOTAL: 6.8 g/dL (ref 6.4–8.3)
SODIUM: 142 mmol/L (ref 136–145)

## 2021-07-09 LAB — CBC WITH DIFF
BASOPHIL #: <0.1 10*3/uL (ref <=0.20)
BASOPHIL %: 1 %
EOSINOPHIL #: 0.27 10*3/uL (ref <=0.50)
EOSINOPHIL %: 4 %
HCT: 37.3 % (ref 34.8–46.0)
HGB: 12.3 g/dL (ref 11.5–16.0)
IMMATURE GRANULOCYTE #: <0.1 10*3/uL (ref <0.10)
IMMATURE GRANULOCYTE %: 0 % (ref 0–1)
LYMPHOCYTE #: 3.06 10*3/uL (ref 1.00–4.80)
LYMPHOCYTE %: 41 %
MCH: 30.1 pg (ref 26.0–32.0)
MCHC: 33 g/dL (ref 31.0–35.5)
MCV: 91.4 fL (ref 78.0–100.0)
MONOCYTE #: 0.91 10*3/uL (ref 0.20–1.10)
MONOCYTE %: 12 %
MPV: 10.4 fL (ref 8.7–12.5)
NEUTROPHIL #: 3.2 10*3/uL (ref 1.50–7.70)
NEUTROPHIL %: 42 %
PLATELETS: 253 10*3/uL (ref 150–400)
RBC: 4.08 10*6/uL (ref 3.85–5.22)
RDW-CV: 13.8 % (ref 11.5–15.5)
WBC: 7.5 10*3/uL (ref 3.7–11.0)

## 2021-07-09 LAB — LIPASE: LIPASE: 36 U/L (ref 10–60)

## 2021-07-09 LAB — HCG, PLASMA OR SERUM QUANTITATIVE, PREGNANCY: HCG QUANTITATIVE PREGNANCY: <2 m[IU]/mL (ref <5)

## 2021-07-09 MED ORDER — PROCHLORPERAZINE EDISYLATE 10 MG/2 ML (5 MG/ML) INJECTION SOLUTION
10.0000 mg | INTRAMUSCULAR | Status: AC
Start: 2021-07-09 — End: 2021-07-09
  Administered 2021-07-09: 10 mg via INTRAVENOUS
  Filled 2021-07-09: qty 2

## 2021-07-09 MED ORDER — SODIUM CHLORIDE 0.9 % IV BOLUS
1000.0000 mL | INJECTION | Status: AC
Start: 2021-07-09 — End: 2021-07-09
  Administered 2021-07-09: 1000 mL via INTRAVENOUS
  Administered 2021-07-09: 0 mL via INTRAVENOUS

## 2021-07-09 MED ORDER — DIPHENHYDRAMINE 50 MG/ML INJECTION SOLUTION
50.0000 mg | INTRAMUSCULAR | Status: AC
Start: 2021-07-09 — End: 2021-07-09
  Administered 2021-07-09: 50 mg via INTRAVENOUS
  Filled 2021-07-09: qty 1

## 2021-07-09 MED ORDER — KETOROLAC 30 MG/ML (1 ML) INJECTION SOLUTION
30.0000 mg | INTRAMUSCULAR | Status: AC
Start: 2021-07-09 — End: 2021-07-09
  Administered 2021-07-09: 30 mg via INTRAVENOUS
  Filled 2021-07-09: qty 1

## 2021-07-09 NOTE — ED Provider Notes (Signed)
Domingo Madeira, MD  [facility]  Emergency Department Visit Note      Chief Complaint:  Abdominal pain    HISTORY OF PRESENT ILLNESS     Kimberly Cooke, date of birth September 20, 1996, is a 25 y.o.female who presents to the Emergency Department on 07/09/2021 with abdominal pain. The patient reports she was sleeping when she woke up with abdominal pain. The patient describes the pain as sharp. The patient reports the last thing she ate was a "a bowl of [employer]" and before that was ice cream, both are foods she has had no problems with in the past. The patient reports her period isn't due for another ten days. The patient denies vomiting, fever, chills, cough, headache, dysuria, hematuria, and diarrhea.    REVIEW OF SYSTEMS     The pertinent positive and negative symptoms are as per HPI. All other systems reviewed and are negative.     PATIENT HISTORY     Past Medical History:  History reviewed. No pertinent past medical history.    Past Surgical History:  Past Surgical History:   Procedure Laterality Date   . Knee arthroscopy w/ acl reconstruction         Family History:  No history of acute family illness given at this time.     Social History:  Social History     Tobacco Use   . Smoking status: Never Smoker   . Smokeless tobacco: Never Used   Vaping Use   . Vaping Use: Never used   Substance Use Topics   . Alcohol use: Yes     Alcohol/week: 3.0 standard drinks     Types: 3 Cans of beer per week     Comment: once a week   . Drug use: Never       Medications:  Current Outpatient Medications   Medication Sig   . acyclovir (ZOVIRAX) 800 mg Oral Tablet 1 4 times per day for SHINGLES   . amoxicillin-pot clavulanate (AUGMENTIN) 500-125 mg Oral Tablet 1 twice per day with food   . Ibuprofen (MOTRIN) 600 mg Oral Tablet Take 1 Tab (600 mg total) by mouth Once per day as needed for Pain   . triamcinolone acetonide (ARISTOCORT) 0.5 % Cream Use a thin layer to affected skin 3 times per day   . VENTOLIN HFA 90  mcg/actuation Inhalation HFA Aerosol Inhaler 1 puff 3 times per day for coughing       Allergies:  No Known Allergies    PHYSICAL EXAM     Vitals:  ED Triage Vitals [07/09/21 0137]   BP (Non-Invasive) (!) 110/47   Heart Rate 77   Respiratory Rate (!) 23   Temperature 36.8 C (98.2 F)   SpO2 100 %   Weight 66.7 kg (147 lb)   Height 1.727 m (5\' 8" )       Constitutional: The patient is alert and oriented to person, place, and time. Well-developed and well-nourished.  HENT: Atraumatic, normocephalic head. Mucous membranes moist. TM's clear, Nares unremarkable. Oropharynx shows no erythema or exudate.   Eyes: Pupils equal and round, reactive to light. No scleral icterus. Normal conjunctiva. Extraocular movements are intact.  Neck: Supple, non-tender, no nuchal rigidity, no adenopathy.   Lungs: Clear to auscultation bilaterally. Symmetric and equal expansion. No respiratory distress or retractions.  Cardiovascular: Heart is S1-S2 regular rate and regular rhythm without murmur click or rub.  Abdomen:  Soft, non-distended. Diffuse tenderness to palpation without evidence of rebound  or guarding. No pulsatile masses. No organomegaly.   Genitourinary: No CVA tenderness.  Extremities: Full range of motion, no clubbing, cyanosis, or edema. Pulses 2+, capillary refill <2 seconds.  Spine: No midline or paraspinal muscle tenderness to palpation. No step-off.   Skin: Warm and dry. No cyanosis, jaundice, rash or lesion.  Neurologic: Alert and oriented x3. Normal facial symmetry and speech, Normal upper and lower extremity strength, and grossly normal sensation.     DIFFERENTIAL DIAGNOSES     1. Biliary colic  2. Lactose intolerance  3. Pancreatitis  4. Kidney stone     DIAGNOSTIC STUDIES     Labs:    Results for orders placed or performed during the hospital encounter of 07/09/21   HCG, PLASMA OR SERUM QUANTITATIVE, PREGNANCY   Result Value Ref Range    HCG QUANTITATIVE PREGNANCY <2 <5 mIU/mL   COMPREHENSIVE METABOLIC PANEL,  NON-FASTING   Result Value Ref Range    SODIUM 142 136 - 145 mmol/L    POTASSIUM 3.9 3.5 - 5.1 mmol/L    CHLORIDE 108 96 - 111 mmol/L    CO2 TOTAL 26 22 - 30 mmol/L    ANION GAP 8 4 - 13 mmol/L    BUN 13 8 - 25 mg/dL    CREATININE 5.00 9.38 - 1.05 mg/dL    BUN/CREA RATIO 15 6 - 22    ESTIMATED GFR >90 >=60 mL/min/BSA    ALBUMIN 3.7 3.5 - 5.0 g/dL     CALCIUM 9.3 8.5 - 18.2 mg/dL    GLUCOSE 94 65 - 993 mg/dL    ALKALINE PHOSPHATASE 50 40 - 110 U/L    ALT (SGPT) 10 8 - 22 U/L    AST (SGOT)  13 8 - 45 U/L    BILIRUBIN TOTAL 0.3 0.3 - 1.3 mg/dL    PROTEIN TOTAL 6.8 6.4 - 8.3 g/dL   LIPASE   Result Value Ref Range    LIPASE 36 10 - 60 U/L   CBC WITH DIFF   Result Value Ref Range    WBC 7.5 3.7 - 11.0 x10^3/uL    RBC 4.08 3.85 - 5.22 x10^6/uL    HGB 12.3 11.5 - 16.0 g/dL    HCT 71.6 96.7 - 89.3 %    MCV 91.4 78.0 - 100.0 fL    MCH 30.1 26.0 - 32.0 pg    MCHC 33.0 31.0 - 35.5 g/dL    RDW-CV 81.0 17.5 - 10.2 %    PLATELETS 253 150 - 400 x10^3/uL    MPV 10.4 8.7 - 12.5 fL    NEUTROPHIL % 42 %    LYMPHOCYTE % 41 %    MONOCYTE % 12 %    EOSINOPHIL % 4 %    BASOPHIL % 1 %    NEUTROPHIL # 3.20 1.50 - 7.70 x10^3/uL    LYMPHOCYTE # 3.06 1.00 - 4.80 x10^3/uL    MONOCYTE # 0.91 0.20 - 1.10 x10^3/uL    EOSINOPHIL # 0.27 <=0.50 x10^3/uL    BASOPHIL # <0.10 <=0.20 x10^3/uL    IMMATURE GRANULOCYTE % 0 0 - 1 %    IMMATURE GRANULOCYTE # <0.10 <0.10 x10^3/uL     Labs reviewed and interpreted by me.    Radiology:    CT ABDOMEN PELVIS WO IV CONTRAST   Final Result      No CT evidence of an acute abdominal or pelvic process.            Radiologist location ID: [id number]  Radiological imaging interpreted by radiologist. Report reviewed by me.    ED PROGRESS NOTE / MEDICAL DECISION MAKING     Old records reviewed by me:  I have reviewed the nurse's notes. I have reviewed the patient's problem list and pertinent past medical records.    Orders Placed This Encounter   . CT ABDOMEN PELVIS WO IV CONTRAST   . HCG, PLASMA OR SERUM  QUANTITATIVE, PREGNANCY   . COMPREHENSIVE METABOLIC PANEL, NON-FASTING   . LIPASE   . URINALYSIS WITH MICROSCOPIC REFLEX IF INDICATED [facility] ONLY   . CBC WITH DIFF   . INSERT & MAINTAIN PERIPHERAL IV ACCESS   . ketorolac (TORADOL) 30 mg/mL injection   . prochlorperazine (COMPAZINE) 5 mg/mL injection   . NS bolus infusion 1,000 mL   . diphenhydrAMINE (BENADRYL) 50 mg/mL injection       0140: Initial evaluation is complete at this time. I discussed with the patient that I would order CT abdomen, HCG, CBC, CMP, lipase, UA, Toradol , Compazine, and benadryl to further evaluate. Patient is agreeable with the treatment plan at this time.     8101: On recheck, the patient is in no acute distress. I explained the results of the diagnostic studies. Patient is to follow up with Chriss Czar, MD in two days. I discussed the diagnosis, disposition, and follow-up plan. The patient understood and is in accordance with the treatment plan at this time. Patient is to return here to the Emergency Department if new or worsening symptoms appear. All of their questions have been answered to their satisfaction. The patient is in stable condition at the time of discharge.     MIPS     Not applicable     OPIATE PRESCRIPTION       Not applicable    CORE MEASURES      Not applicable    CRITICAL CARE TIME      Not applicable     PRE-DISPOSITION VITALS      Pre-Disposition Vitals:  Filed Vitals:    07/09/21 0137 07/09/21 0203   BP: (!) 110/47 115/68   Pulse: 77 73   Resp: (!) 23 16   Temp: 36.8 C (98.2 F)    SpO2: 100% 100%       CLINICAL IMPRESSION     Encounter Diagnosis   Name Primary?   . Abdominal pain Yes       DISPOSITION/PLAN     Discharged    Follow-Up:     Chriss Czar, MD  [address]  [place] [address]  [phone]    Call in 2 days  for follow up      Condition at Disposition: Stable    SCRIBE ATTESTATION STATEMENT  I Salena Saner, SCRIBE scribed for Kara Pacer, MD.     Documentation assistance provided  for Kara Pacer, MD by Salena Saner, SCRIBE. Information recorded by the scribe was done at my direction and has been reviewed and validated by me Kara Pacer, MD.

## 2021-07-09 NOTE — ED Nurses Note (Signed)
Discharge instructions given written and verbal. No rx's given at this time. Will follow up w/ PMD in couple of days. Verbalized understanding of d/c and follow up.

## 2021-07-09 NOTE — ED Triage Notes (Signed)
Pt states around 2330 she woke up with a sharp abdominal pain that's located in the center. Pt states that she feels like she has been very bloated the past 2 days. Pt denies any urinary issues or BM issues.

## 2021-07-10 ENCOUNTER — Other Ambulatory Visit
Admission: RE | Admit: 2021-07-10 | Discharge: 2021-07-10 | Disposition: A | Discharge status: Home / Self Care | Payer: No Typology Code available for payment source | Source: Ambulatory Visit | Attending: Gynecology | Admitting: Gynecology

## 2021-07-11 ENCOUNTER — Other Ambulatory Visit
Admission: RE | Admit: 2021-07-11 | Discharge: 2021-07-11 | Disposition: A | Discharge status: Home / Self Care | Payer: No Typology Code available for payment source | Source: Ambulatory Visit | Attending: Gynecology | Admitting: Gynecology

## 2021-07-11 ENCOUNTER — Other Ambulatory Visit: Payer: Self-pay | Admitting: Gynecology

## 2021-07-11 DIAGNOSIS — Z01419 Encounter for gynecological examination (general) (routine) without abnormal findings: Secondary | ICD-10-CM

## 2021-07-11 LAB — VH STD AMPLIFIED DNA PROBE
Chlamydia trachomatis: NEGATIVE
Neisseria gonorrhoeae: NEGATIVE

## 2021-07-13 LAB — VH PAP TEST THIN PREP: Pap Test Thin Prep: NEGATIVE

## 2022-03-20 ENCOUNTER — Telehealth (INDEPENDENT_AMBULATORY_CARE_PROVIDER_SITE_OTHER): Payer: Self-pay | Admitting: INTERNAL MEDICINE

## 2022-03-20 NOTE — Telephone Encounter (Signed)
Patient needs physical. Please schedule or update chart.    Melissa Green,  [facility],  Office Supervisor

## 2022-03-21 NOTE — Telephone Encounter (Signed)
Called patient and no answer. LVM to give [place] a call back and let us know if being seen elsewhere or to scheduled physical appt    Antoine Primas, Registration Specialist  [facility]  03/21/2022, 11:19

## 2022-03-22 ENCOUNTER — Encounter (INDEPENDENT_AMBULATORY_CARE_PROVIDER_SITE_OTHER): Payer: Self-pay | Admitting: INTERNAL MEDICINE

## 2022-03-22 NOTE — Telephone Encounter (Signed)
Letter sent via MyChart/Mail.    Antoine Primas, Registration Specialist  [facility]  03/22/2022, 09:08

## 2022-05-12 ENCOUNTER — Telehealth (HOSPITAL_COMMUNITY): Payer: Self-pay | Admitting: INTERNAL MEDICINE

## 2022-05-12 MED ORDER — AMOXICILLIN 500 MG CAPSULE
500.0000 mg | ORAL_CAPSULE | Freq: Three times a day (TID) | ORAL | 0 refills | Status: DC
Start: 2022-05-12 — End: 2022-06-03

## 2022-06-03 ENCOUNTER — Other Ambulatory Visit: Payer: Self-pay

## 2022-06-03 ENCOUNTER — Ambulatory Visit (INDEPENDENT_AMBULATORY_CARE_PROVIDER_SITE_OTHER): Admitting: INTERNAL MEDICINE

## 2022-06-03 ENCOUNTER — Other Ambulatory Visit: Attending: INTERNAL MEDICINE

## 2022-06-03 ENCOUNTER — Encounter (INDEPENDENT_AMBULATORY_CARE_PROVIDER_SITE_OTHER): Payer: Self-pay | Admitting: INTERNAL MEDICINE

## 2022-06-03 VITALS — BP 116/72 | HR 60 | Temp 97.7°F | Ht 68.0 in | Wt 154.6 lb

## 2022-06-03 DIAGNOSIS — Z0000 Encounter for general adult medical examination without abnormal findings: Secondary | ICD-10-CM

## 2022-06-03 DIAGNOSIS — D649 Anemia, unspecified: Secondary | ICD-10-CM

## 2022-06-03 LAB — COMPREHENSIVE METABOLIC PNL, FASTING
ALBUMIN: 3.9 g/dL (ref 3.5–5.0)
ALKALINE PHOSPHATASE: 42 U/L (ref 40–110)
ALT (SGPT): 14 U/L (ref 8–22)
ANION GAP: 10 mmol/L (ref 4–13)
AST (SGOT): 14 U/L (ref 8–45)
BILIRUBIN TOTAL: 0.3 mg/dL (ref 0.3–1.3)
BUN/CREA RATIO: 15 (ref 6–22)
BUN: 12 mg/dL (ref 8–25)
CALCIUM: 8.8 mg/dL (ref 8.5–10.0)
CHLORIDE: 107 mmol/L (ref 96–111)
CO2 TOTAL: 22 mmol/L (ref 22–30)
CREATININE: 0.8 mg/dL (ref 0.60–1.05)
ESTIMATED GFR: >90 mL/min/BSA (ref >=60)
GLUCOSE: 104 mg/dL — ABNORMAL HIGH (ref 70–99)
POTASSIUM: 4 mmol/L (ref 3.5–5.1)
PROTEIN TOTAL: 6.9 g/dL (ref 6.4–8.3)
SODIUM: 139 mmol/L (ref 136–145)

## 2022-06-03 LAB — CBC WITH DIFF
BASOPHIL #: <0.1 10*3/uL (ref <=0.20)
BASOPHIL %: 1 %
EOSINOPHIL #: 0.2 10*3/uL (ref <=0.50)
EOSINOPHIL %: 3 %
HCT: 39.1 % (ref 34.8–46.0)
HGB: 11.6 g/dL (ref 11.5–16.0)
IMMATURE GRANULOCYTE #: <0.1 10*3/uL (ref <0.10)
IMMATURE GRANULOCYTE %: 0 % (ref 0–1)
LYMPHOCYTE #: 2.23 10*3/uL (ref 1.00–4.80)
LYMPHOCYTE %: 30 %
MCH: 27.8 pg (ref 26.0–32.0)
MCHC: 29.7 g/dL — ABNORMAL LOW (ref 31.0–35.5)
MCV: 93.8 fL (ref 78.0–100.0)
MONOCYTE #: 0.79 10*3/uL (ref 0.20–1.10)
MONOCYTE %: 11 %
MPV: 11.3 fL (ref 8.7–12.5)
NEUTROPHIL #: 4.09 10*3/uL (ref 1.50–7.70)
NEUTROPHIL %: 55 %
PLATELETS: 267 10*3/uL (ref 150–400)
RBC: 4.17 10*6/uL (ref 3.85–5.22)
RDW-CV: 13.7 % (ref 11.5–15.5)
WBC: 7.4 10*3/uL (ref 3.7–11.0)

## 2022-06-03 LAB — LIPID PANEL
CHOL/HDL RATIO: 2.9
CHOLESTEROL: 163 mg/dL (ref 100–200)
HDL CHOL: 56 mg/dL (ref >=50)
LDL CALC: 83 mg/dL (ref <100)
NON-HDL: 107 mg/dL (ref <=190)
TRIGLYCERIDES: 141 mg/dL (ref <150)
VLDL CALC: 22 mg/dL (ref <30)

## 2022-06-03 NOTE — Nursing Note (Signed)
BP 116/72   Pulse 60   Temp 36.5 C (97.7 F) (Thermal Scan)   Ht 1.727 m (5\' 8" )   Wt 70.1 kg (154 lb 9.6 oz)   SpO2 99%   BMI 23.51 kg/m     , LPN

## 2022-06-03 NOTE — Nursing Note (Signed)
06/03/22 1527   Depression Screen   Little interest or pleasure in doing things. 0   Feeling down, depressed, or hopeless 0   PHQ 2 Total 0

## 2022-06-03 NOTE — Progress Notes (Signed)
[facility]   [address]   [place], [place] [address]  [phone]  [phone]    Wellness Visit     SUBJECTIVE   Kimberly Cooke is a 26 y.o. female here to check-in on primary care issues.     I saw her in the office a year ago     COVID -- vaccinated x 2 --> had in summer 2022 (totall resolved)   Flu -- no flu in winter 2023     Since our last visit together, has experienced the following healthcare interactions:   ER -- visit in August 2022 --> none since then   Urgent care visits -- none in 2023   Accidents with a car -- nothing in 2023   Injuries at home --   Falls -- none in 2023    Cuts/burns -- none in 2023    Other trauma -- none in 2023   Medications from other providers -- nothing new outside this office   Surgeries or other procedures -- nothing invasive in the past year     Vision -- went to the eye doctor in 2023   contact/glasses for driving: a slight changes      Hearing -- no new damage   No ringing   No loss     Sources of pain which are new -- her knees hurt    Frequency -- often     Swelling -- left one more than the right    Problematic activities -- running is the worst for her    Problematic activities -- kneeling is less painful than before    New injuries -- none new in 5701    Clicking/popping -- not having    Get stuck in a bent position -- not having      Sleep quality -- no issues with sleeping     Tobacco products --    Tobacco -- none    Vaping -- none     Cannabis -- we discussed     Alcohol consumption -- social use     Contact with other providers -- saw the GYN provider in 2020    Exercise inventory -- super-fit, constantly working out and is physically fit     Depression during [place] --  Did have some     HIV risks -- no new risks     Tylenol inventory -- none at all     NSAID Inventory -- ibuprofen for her     Medications reviewed by me :   Oral birth control pills - same   Ibuprofen 2 just PRN for her left knee -->     No energy drinks   NO  CBD products   No herbal supplements     Changes in family history since we last met --   Mother -- alive and well   Father -- died of pancreatic cancer in his 72's   Brother -- had open-heart surgery for an anomolous coronary artery     Changes in social history since we last met --   [employer] graduate school   No recent travel          Medications reviewed    Current Outpatient Medications:   .  oral contraceptive (PATIENT'S OWN SUPPLY), Take 1 Tablet by mouth Once a day, Disp: , Rfl:       Review of Systems -     Constitutional: weight loss   Eyes: no new blurry or  double vision   ENT: no acute hearing loss   Cardiovascular: no new chest pain   Respiratory: no new shortness of breath  GI: no new vomiting or diarrhea   GU: no new dysuria or hematuria   Musculoskeletal: chronic aches and pains   Neurological: no new seizure or headaches   Emotional/Pscychiatric: see below   Skin: no suspicious moles which are new; no acute rashes   Endocrine: no new diabetes   Hematologic/Lymphatic: no DVT or bleeding issues      Social History     Tobacco Use   Smoking Status Never   Smokeless Tobacco Never       OBJECTIVE:  There were no vitals taken for this visit.        Counseling done about the following face-to-face:     Labs reviewed by me   Drug-drug interactions considered and reviewed by me     Physical Exam   General -- alert and oriented   Repeat BP taken by me in the left arm -- 90/60     HEENT -- PERRL, EOMI   Heart -- RRR and rhythm   Neck -- Thyroid is normal sized  Lungs -- clear and no wheezing; no stridor  Abdomen -- soft and tender; nor guarding  Neuro -- normal gait and tone; strength is 5/5  Skin -- no rashes noted   Psych -- pleasant  Left wrist -- minimally tender on distal aspect of the left wrist   BL knees -- good ROM in BL knees   Vascular -- no edema      Labs   August 2022    WBC 7.5, Hgb 12.3, platelets 253    Glucose 94    Albumin 3.7, AST 13, ALT 10    CT abdomen and pelvis -- nothing acute      September 2021   COVID negative    Urgent care visit     November 2020   COVID - negative     August 2020   COVID - negative       Medications -- reviewed by me; drug-drug interactions considered by me    Counseling provided directly face-to-face about: getting       ASSESSMENT AND PLAN:  Multiple previous knee surgeries -- Myrna has had both of her knee operated on  This past year has been pretty good for her   Some osteoarthritis   Her use of ibuprofen is that not often: 433m daily PRN   No need for imaging at this time   No need for Orthopedics referral at this time     Concern about possible anemia -- I will check a CBC and ferritin levels now     Screening for Diabetes -- no reason to check until after 24years old     High cholesterol screening -- no reason to screen until after 25years old     Vision -- has eye glasses    Hearing issues -- no limitations     Exercise --  She is very fit     Need for HIV or hepatitis C screening -- no reason to screen at this time     Tobacco abuse screening -- no tobacco products whatsoever     Alcohol and drug abuse screening -- weekly alcohol use: denies drinking     Depression/anxiety screening -- had a major flare-up of depression in the spring: related to graduating and the death of her father. Did multiple  weeks of grief counseling and this was very helpful.   We discussed today: no need for medication at this time   Continue journal, connecting to fresh air during tough weeks; and periodic use of counseling     Family history of pancreatic cancer -- father died of pancreatic cancer.   [name] was tested and is negative for genes related to pancreatic cancer     Screening for colorectal cancer -- she can wait until she is 26 years old     Screening for breast cancer -- no elevated family history at all.   Can wait until she is 26 years old for mammography     GYN -- she sees Dr. Nicole Kindred     Immunizations -- I recommend a yearly flu shot: got in 2022        DISPOSITION:   Follow up in a year sooner PRN   Keep up the good work     Check CBC today and ferritin levels   See me in a year sooner PRN

## 2022-06-04 LAB — FERRITIN: FERRITIN: 29 ng/mL (ref 5–200)

## 2023-03-10 ENCOUNTER — Encounter (HOSPITAL_BASED_OUTPATIENT_CLINIC_OR_DEPARTMENT_OTHER): Payer: Self-pay | Admitting: ORTHOPAEDIC SURGERY

## 2023-03-10 ENCOUNTER — Encounter (INDEPENDENT_AMBULATORY_CARE_PROVIDER_SITE_OTHER): Payer: Self-pay

## 2023-03-10 ENCOUNTER — Ambulatory Visit (HOSPITAL_BASED_OUTPATIENT_CLINIC_OR_DEPARTMENT_OTHER): Admitting: ORTHOPAEDIC SURGERY

## 2023-03-10 ENCOUNTER — Other Ambulatory Visit: Payer: Self-pay

## 2023-03-10 VITALS — Ht 68.0 in | Wt 153.4 lb

## 2023-03-10 DIAGNOSIS — S83241A Other tear of medial meniscus, current injury, right knee, initial encounter: Secondary | ICD-10-CM

## 2023-03-10 DIAGNOSIS — M25561 Pain in right knee: Secondary | ICD-10-CM

## 2023-03-10 DIAGNOSIS — Z9889 Other specified postprocedural states: Secondary | ICD-10-CM

## 2023-03-10 NOTE — Progress Notes (Signed)
[facility], Adult Reconstruction    Chief Complaint:   Chief Complaint   Patient presents with    Knee Pain     R knee injury  Playing soccer about 2 months ago  Having pain when running  Did have swelling when she injured but has gotten better  Had acl surgery 1 right, twice on L knee        HPI:  This is a 27 y.o. year old female who Presents for evaluation of right knee pain.  She has a previous college soccer athlete, and previously sustained a right ACL tear undergoing reconstruction in what she believes is 2014, and has done well with it since then.  She also states she is undergone 2 ACL reconstructions of the left knee.  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  She underwent a period of about 6 weeks with rest, anti-inflammatories, and exercises recommended by her physical therapist, and states that the last time that she tried to run after the period of rest, she developed severe acute pain again, and it was recommended that she be evaluated.  She does complain of episodes of instability, as well as mechanical symptoms.  She has no other complaints today.    PMH:  No past medical history on file.      PSH:  Past Surgical History:   Procedure Laterality Date    KNEE ARTHROSCOPY W/ ACL RECONSTRUCTION           Medications:    Current Outpatient Medications:     oral contraceptive (PATIENT'S OWN SUPPLY), Take 1 Tablet by mouth Once a day, Disp: , Rfl:     Allergy:  No Known Allergies    Family History:    Family Medical History:    None       Social History:  Social History     Socioeconomic History    Marital status: Single     Spouse name: Not on file    Number of children: Not on file    Years of education: Not on file    Highest education level: Not on file   Occupational  History    Not on file   Tobacco Use    Smoking status: Never    Smokeless tobacco: Never   Vaping Use    Vaping status: Never Used   Substance and Sexual Activity    Alcohol use: Yes     Alcohol/week: 3.0 standard drinks of alcohol     Types: 3 Cans of beer per week     Comment: once a week    Drug use: Never    Sexual activity: Not on file   Other Topics Concern    Not on file   Social History Narrative    Not on file     Social Determinants of Health     Financial Resource Strain: Not on file   Transportation Needs: Unknown (12/15/2022)    Received from [facility]     In the past 3 months, has lack of transporation kept you from medical appointments or getting things you need that are essential to your health?: Not on file   Social Connections: Unknown (01/31/2023)    Received from  [facility]     In the past 3 months, do you feel that you lack companionship or social support?: Not on file   Intimate Partner Violence: Not on file   Housing Stability: Not on file                                                                                                                                                                                   Review of Systems:  The ROS is negative    The remainder of the review of systems is negative    Exam:  Ht 1.727 m (5\' 8" )   Wt 69.6 kg (153 lb 6.4 oz)   BMI 23.32 kg/m       Right Knee Exam:  Inspection:  Well-healed surgical incision from previous ACL  Tenderness:  medial  joint line, mild tenderness over the origin of the MCL  Gait:  Stable without assistive device and normal  AP Translation:Stable, grade 1 Lachman  Varus Stress: Stable <6 degrees with no exacerbation of pain  Valgus Stress: Stable <6 degrees with no exacerbation of pain  Extensor Lag: Absent   McMurray's:Present  Knee Effusion: mild  Patellar inhibition and grind test:Absent   5/5 plantarflexion, dorsiflexion  2+ PT/DP Pulse    ROM of the right  knee:  Extension: 0  Flexion: 125      X-rays:  Today's images of the right knee reveal no acute fracture or malalignment, status post right ACL reconstruction with no evidence of hardware complication noted.      Impression:      ICD-10-CM    1. Right knee pain  M25.561 XR KNEE RIGHT 4 OR MORE VIEWS     MRI KNEE RIGHT WO CONTRAST      2. Acute medial meniscus tear of right knee, initial encounter  S83.241A MRI KNEE RIGHT WO CONTRAST           Assessment/Plan:    The above clinical and radiograph findings were discussed with the patient are most consistent with a medial meniscus tear based on physical exam findings.  Her ACL felt stable on exam today, however this can not be completely ruled out due to her previous surgery.  She is failed conservative management, therefore I provided her with a referral to obtain an MRI of her right knee for further evaluation.  Once the MRI has been reviewed she can return for further discussion of treatment options.    Mayer Masker, MD     Portions of this note may be dictated using voice recognition software or a dictation service. Variances in spelling and vocabulary are possible and unintentional.  Not all errors are caught/corrected. Please notify the [facility] if any discrepancies are noted or if the meaning of any statement is not clear.      Attempt has been made to edit this document but some errors may remain. It should not be considered a word for word legal document, but is created to help myself and others care for the patient.  I reserve the right to interpret this document as I believe it was intended.

## 2023-03-24 ENCOUNTER — Encounter (HOSPITAL_BASED_OUTPATIENT_CLINIC_OR_DEPARTMENT_OTHER): Admitting: ORTHOPAEDIC SURGERY

## 2023-04-01 NOTE — Progress Notes (Unsigned)
[facility] MEDICAL OFFICE BUILDING  [facility]  [address]  [place] [address]  Dept: [phone]  Dept Fax: [phone]  Loc: [phone]  Loc Fax: [phone]        HPI:   Kimberly Cooke is a 27 y.o. female who presents for follow-up to clinic for right knee pain.  Patient was last seen I want my partners and recommended an MRI to better evaluate the patient's knee and then had the patient follow-up myself to discuss further options given that the previously reconstructed ACL appeared to be torn in fully degenerated as well as patient having a lateral meniscus tear.  Since then patient states she occasionally gets minor pain whenever she is running for long distances which she mainly does at this time.  She has no longer actively playing soccer she is switched over to golf as her sport.  She states that when she does have pain it is over on the inside portion of her knee.  She denies any instability at this time no buckling.    ROS:  GENERAL: No fever or chills  HEENT: no current nosebleed; no new onset visual disturbances  NECK: no swelling  CARDIOVASCULAR:  No severe chest pain  RESPIRATORY:  No severe SOA  ABDOMINAL: no severe abdominal pain  MSK: as per HPI  NEURO: no severe headache; as per HPI  SKIN: no current major rash  HEME: no major bruising or bleeding issues  EXTREMITIES: as per HPI  PSYCH: no delusions/hallucinations    PMH:   No past medical history on file.  Past Medical History was reviewed and is negative for .    Past Surgical History:   Procedure Laterality Date    KNEE ARTHROSCOPY W/ ACL RECONSTRUCTION            There are no problems to display for this patient.      MEDICATIONS:  Current Outpatient Medications   Medication Sig    oral contraceptive (PATIENT'S OWN SUPPLY) Take 1 Tablet by mouth Once a day        ALLERGIES:   No Known Allergies     FAMILY HISTORY:  Family Medical History:    None           SOCIAL HISTORY:   reports that she has never smoked. She  has never used smokeless tobacco. She reports current alcohol use of about 3 cans of beer per week. She reports that she does not use drugs.    PHYSICAL EXAM:  Ht 1.727 m ( )   Wt 69.4 kg (153 lb)   BMI 23.26 kg/m       GENERAL:  No acute distress; clean, appropriately dressed; well-nourished  HEENT: head normocephalic, atraumatic; nose w/out rhinorrhea/epistaxis  EYES: EOMI; vision intact   NECK: no JVD or obvious swelling  CARDIOVASCULAR: no pallor or cyanosis  RESPIRATORY:  non-labored breathing; symmetric chest wall rise bilat; no audible wheezing  ABDOMEN: no severe distention  NEURO:  Alert and oriented x3; conversant  EXTREMITIES: no diffuse obvious swelling  HEME: no diffuse obvious bruising or bleeding  PSYCH: normal affect and mood  MSK:    RIGHT Knee   Appearance:  Skin is intact, no erythema or increased warmth compared to contralateral extremity  Fluid:  None  Motion:    Flexion:  140    Extension:  Hyperextends 5   Meniscal Provocative Maneuvers:    Flexion circumduction:  Negative    McMurray's:  Negative   Ligamentous Exam:  ACL:      Anterior Drawer: 1+       Lachman: 2A     Pivot shift: 1+    PCL:     Posterior Drawer: negative    MCL:     Valgus stress: <5 mm medial joint space widening, firm end point    LCL:     Varus Stress: <5 mm lateral joint space widening, firm end point   Tenderness    Medial joint line:  Mild pain at the medial femoral condyle as well as the proximal MCL    Lateral joint line:  Negative    Miscellaneous:  None   Neurovascular    Intact sensation DPN/SPN/Tibial nerve distribution, 2+ DP pulse, <2 sec capillary refill      IMAGING:   MRI of the right knee demonstrates both a tibial tunnel in the femoral tunnel indicative of previous ACL reconstruction the tunnel with his actually fairly narrow with the graft material and the tunnel being roughly 5 mm in width.  This is also true on the tibial side there has only a speck of tendon seen at the insertion on the femur  on the axial view then there is some minimal fibers that extended towards the tibia however there appears to be significant degeneration midsubstance at this point and there are some remaining fibers on the tibial insertion.  Addition to this there was a posterior horn lateral meniscus tear.  Is also some mild degenerative changes along the lateral femoral condyle there is subchondral edema indicative of pivoting.  Images were personally reviewed and independently interpreted by me at today's visit.   Caro Hight, MD  04/01/2023, 13:47       ASSESSMENT/PLAN:    ICD-10-CM    1. Right knee pain, unspecified chronicity  M25.561 XR KNEE RIGHT 4 OR MORE VIEWS      2. ACL tear  S83.519A         Had a long discussion with the patient and her mother was with her today she has a stable knee as she denies any instability in her knee also feels stable on exam.  I discussed that she does have a small lateral meniscus tear seen on the MRI which appears to be real her medial meniscus appears to be intact and she does not have any edema within the medial femoral condyle or medial tibial plateau.  She does have some mild tenderness at the pes so this may be hamstring related.  Discussed that structurally her knee looks pretty good on exam as well as on the MRI she continued her activity as she can tolerate discussed that if she has a another episode where she has significant pain in the knee she should follow up with me as soon as possible to better identify the area and possibly perform an injection that would be diagnostic and therapeutic she will follow up as needed.  Caro Hight, MD  04/01/2023, 13:47    Portions of this note may be dictated using voice recognition software or a dictation service. Variances in spelling and vocabulary are possible and unintentional. Not all errors are caught/corrected. Please notify the [facility] if any discrepancies are noted or if the meaning of any statement is not clear.

## 2023-04-02 ENCOUNTER — Encounter (HOSPITAL_BASED_OUTPATIENT_CLINIC_OR_DEPARTMENT_OTHER)

## 2023-04-02 ENCOUNTER — Other Ambulatory Visit: Payer: Self-pay

## 2023-04-02 ENCOUNTER — Ambulatory Visit (HOSPITAL_BASED_OUTPATIENT_CLINIC_OR_DEPARTMENT_OTHER): Admitting: Orthopaedic Surgery

## 2023-04-02 VITALS — Ht 68.0 in | Wt 153.0 lb

## 2023-04-02 DIAGNOSIS — S83519A Sprain of anterior cruciate ligament of unspecified knee, initial encounter: Secondary | ICD-10-CM

## 2023-04-02 DIAGNOSIS — M25561 Pain in right knee: Secondary | ICD-10-CM

## 2023-06-03 ENCOUNTER — Ambulatory Visit (INDEPENDENT_AMBULATORY_CARE_PROVIDER_SITE_OTHER): Payer: Self-pay | Admitting: INTERNAL MEDICINE

## 2023-06-03 MED ORDER — SCOPOLAMINE 1 MG OVER 3 DAYS TRANSDERMAL PATCH
MEDICATED_PATCH | TRANSDERMAL | 2 refills | Status: AC
Start: 2023-06-03 — End: ?

## 2023-06-03 NOTE — Telephone Encounter (Signed)
Kimberly Cooke contacted me (our families are friends and know each other well)     She is going to fly to [place] this week and asked about medication for seasickness. I am going to Rx scopolamine patches and recommend that she take melatonin once she is in [place]. I also recommended no alcohol in-flight and getting onto local time as she as she takes off from [place].

## 2024-05-21 ENCOUNTER — Ambulatory Visit (INDEPENDENT_AMBULATORY_CARE_PROVIDER_SITE_OTHER): Payer: Self-pay | Admitting: INTERNAL MEDICINE

## 2024-05-21 MED ORDER — ONDANSETRON 4 MG DISINTEGRATING TABLET
ORAL_TABLET | ORAL | 1 refills | Status: AC
Start: 2024-05-21 — End: ?

## 2024-05-21 NOTE — Telephone Encounter (Signed)
 I was called today by Socorro Dunks   Sx -- very nauseated, struggling to keep food and water down   Getting married tomorrow in [place] at [employer]     We discussed options   I am going to make Zofran 4mg  TID PRN available now   --> [employer]
